# Patient Record
Sex: Female | Born: 2000 | Race: Black or African American | Hispanic: No | Marital: Single
Health system: Midwestern US, Community
[De-identification: ages and names within clinical notes are randomized; demographics above are authoritative.]

---
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
## 2023-10-26 IMAGING — US US THYROID
1 series · 14 of 25 positions shown · non-contrast
Comparison: None.

CLINICAL DATA: Painful thyroid on exam for 2 weeks, concern for
goiter

EXAM:
THYROID ULTRASOUND
TECHNIQUE: Ultrasound examination of the thyroid gland and adjacent soft
tissues was performed.

[Series 1: us thyroid · 0.05mm/px · 14 of 41 slices shown]
[im 1/41]
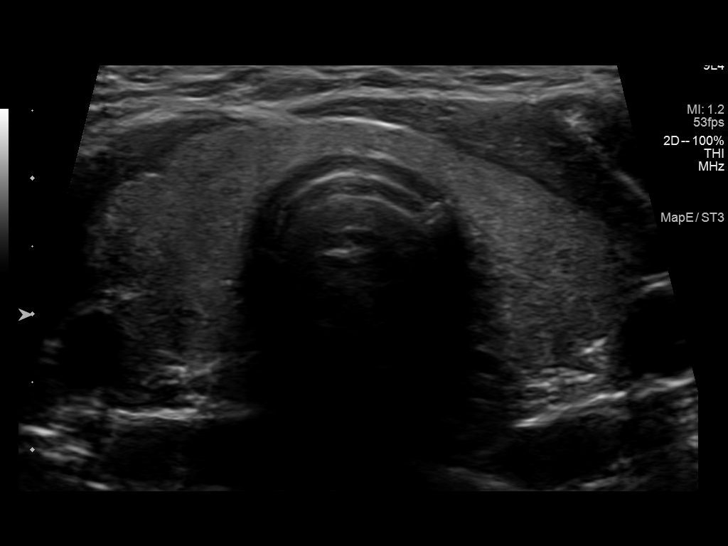
[im 4/41]
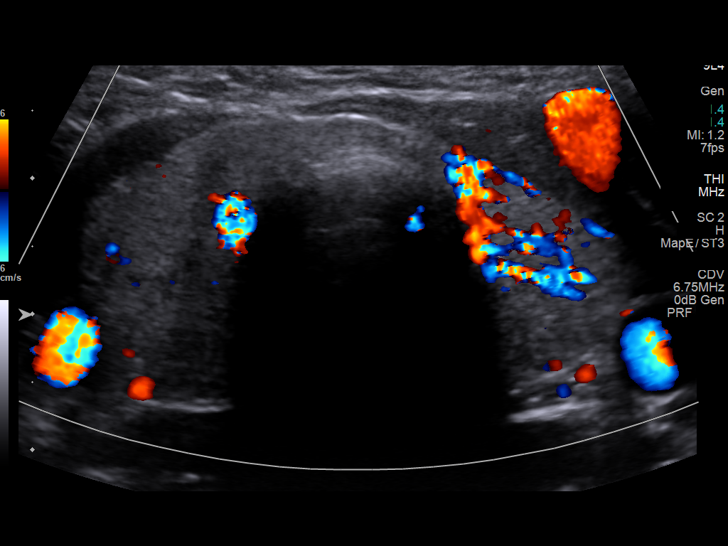
[im 7/41]
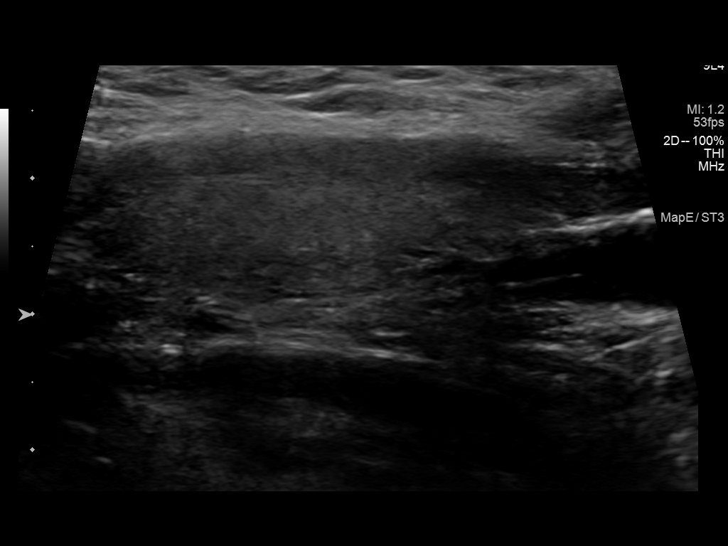
[im 11/41]
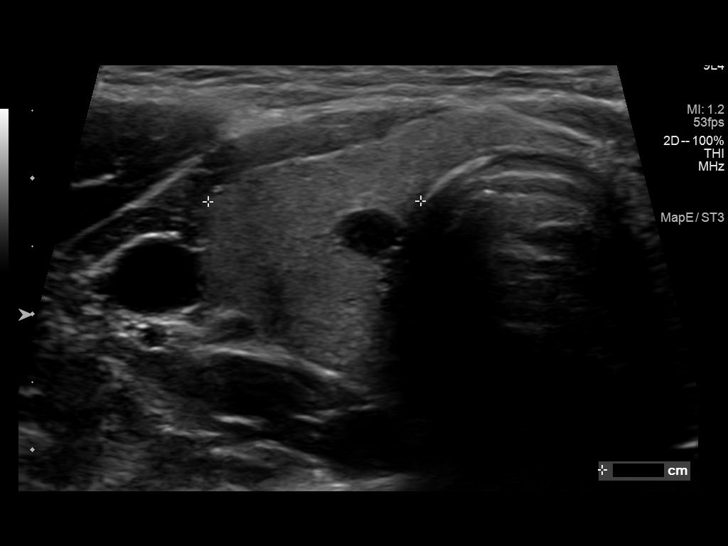
[im 14/41]
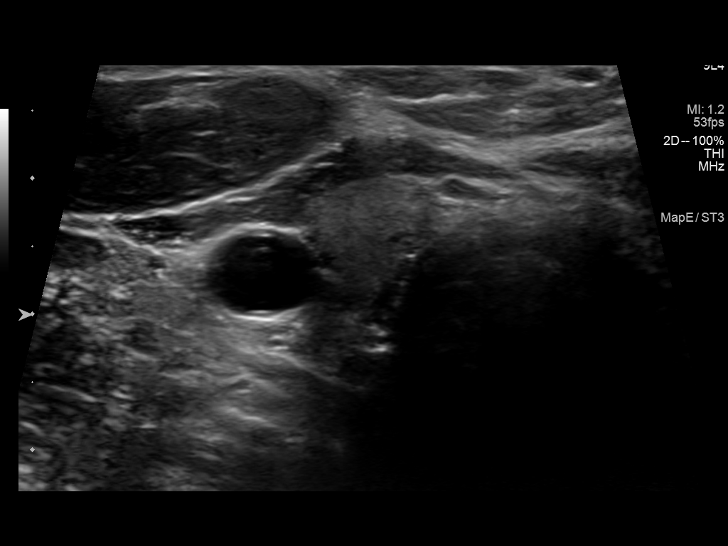
[im 16/41]
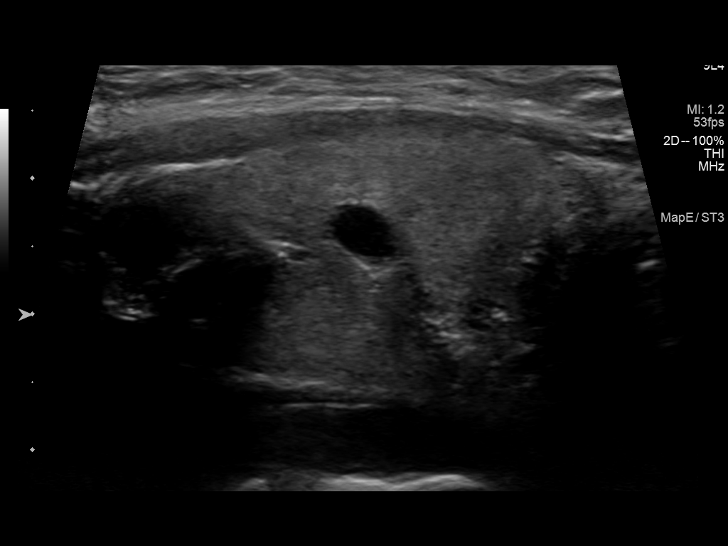
[im 19/41]
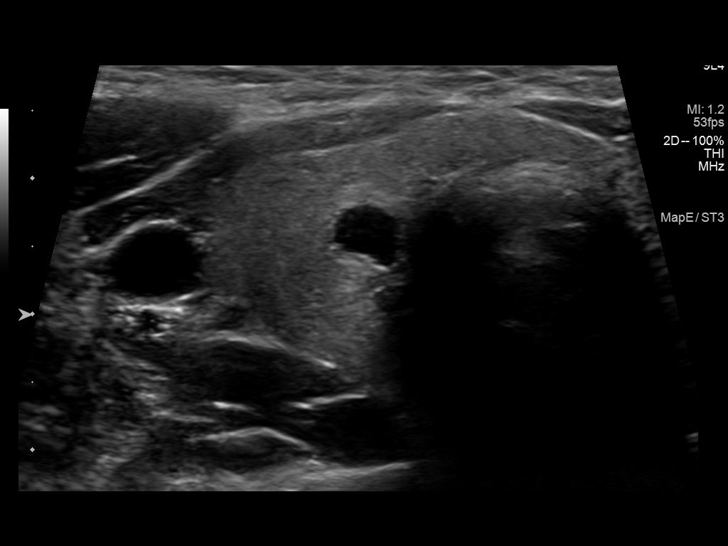
[im 22/41]
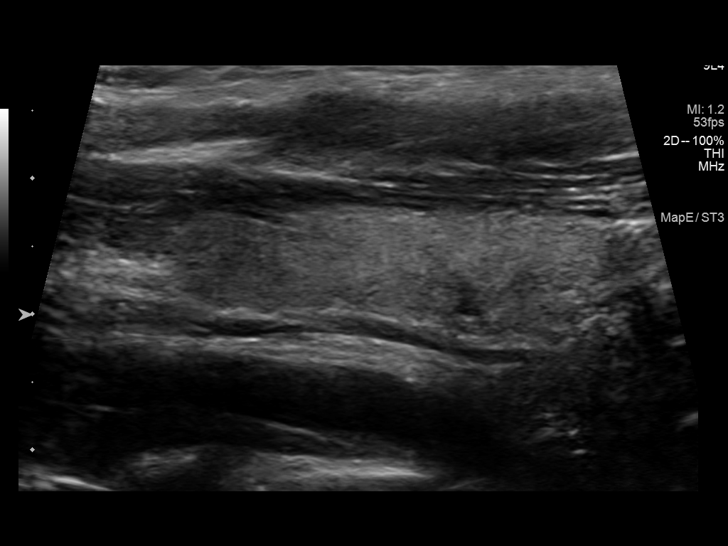
[im 26/41]
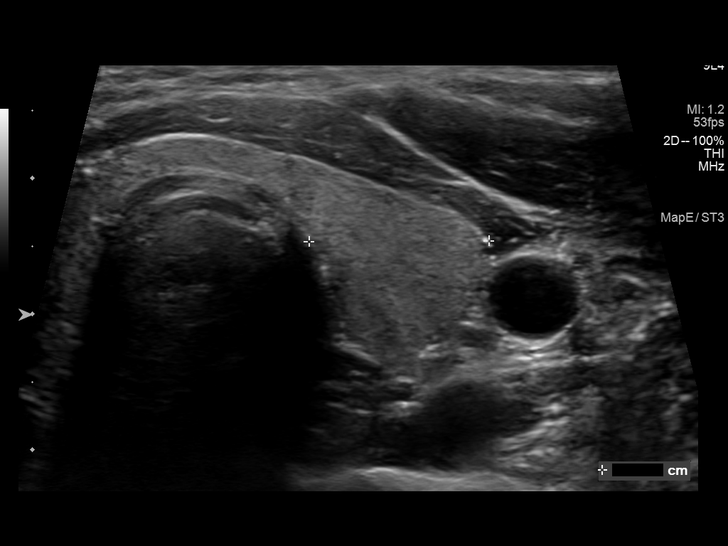
[im 27/41]
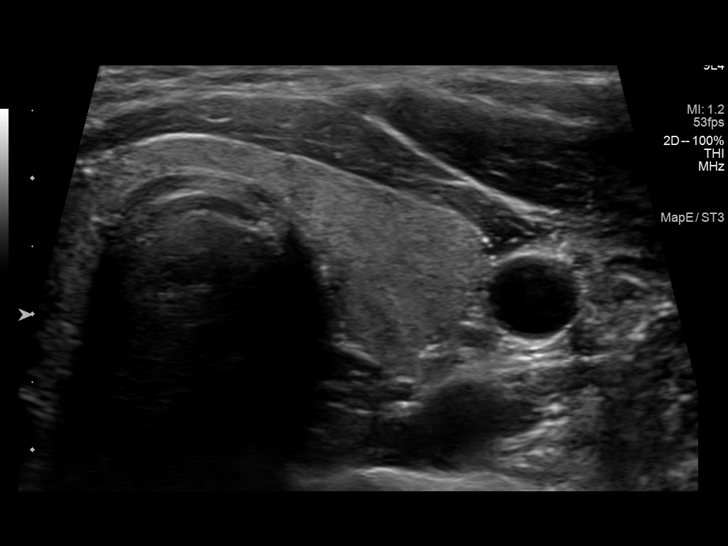
[im 31/41]
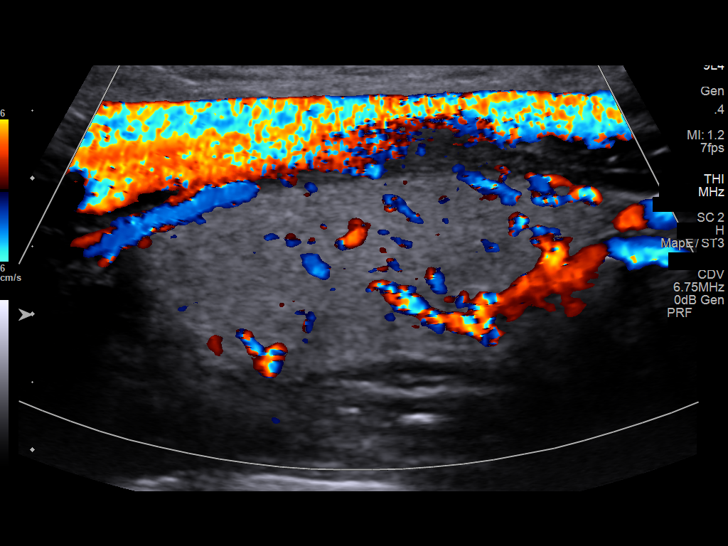
[im 34/41]
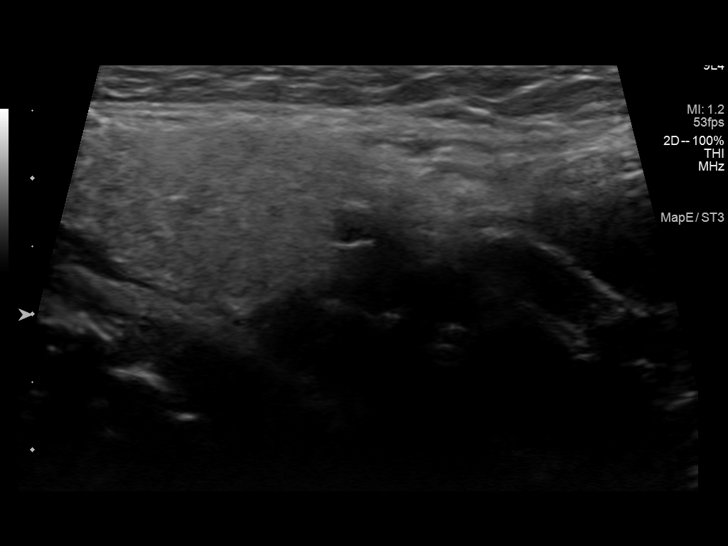
[im 37/41]
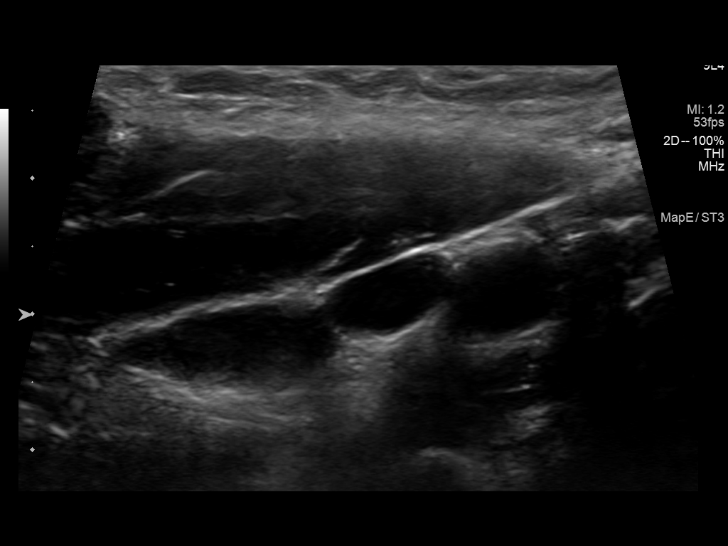
[im 41/41]
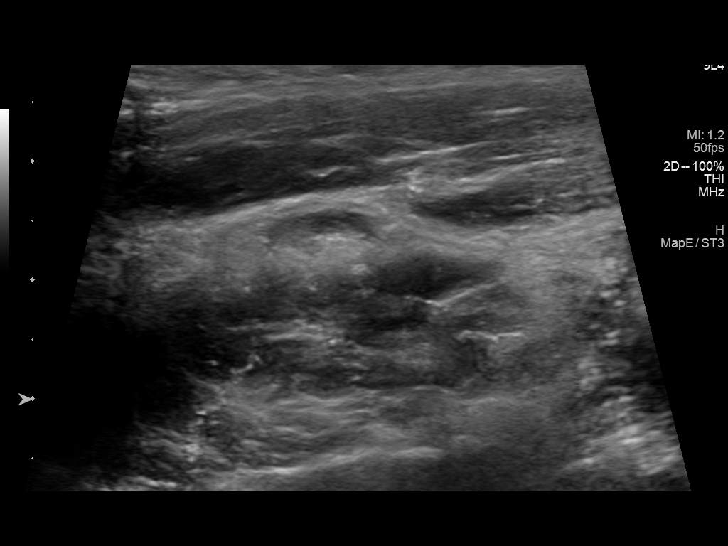

[14 of 25 positions shown; findings below may reference images not displayed]

FINDINGS: Parenchymal Echotexture: Normal

Isthmus: 3 mm

Right lobe: 4.3 x 1.4 x 1.6 cm

Left lobe: 4.0 x 1.3 x 1.3 cm

_________________________________________________________

Estimated total number of nodules >/= 1 cm: 0

Number of spongiform nodules >/=  2 cm not described below (TR1): 0

Number of mixed cystic and solid nodules >/= 1.5 cm not described
below (TR2): 0

_________________________________________________________

Benign right mid thyroid subcentimeter cystic nodule measures only 5
mm. No other significant thyroid abnormality. No hypervascularity.
Prominent but benign-appearing right cervical lymph nodes noted.
IMPRESSION: 5 mm right mid thyroid benign cystic nodule.

No other significant finding by ultrasound.

The above is in keeping with the ACR TI-RADS recommendations - [HOSPITAL] 6425;[DATE].
